# Patient Record
Sex: Male | Born: 1990 | Race: White | Hispanic: No | Marital: Single | Smoking: Current every day smoker
Health system: Southern US, Community
[De-identification: ages and names within clinical notes are randomized; demographics above are authoritative.]

---
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
## 2010-04-21 ENCOUNTER — Ambulatory Visit: Payer: Self-pay | Admitting: Family Medicine

## 2010-04-21 ENCOUNTER — Encounter: Payer: Self-pay | Admitting: Sports Medicine

## 2010-04-21 LAB — CONVERTED CEMR LAB: Rapid Strep: NEGATIVE

## 2010-05-20 ENCOUNTER — Telehealth: Payer: Self-pay | Admitting: *Deleted

## 2010-05-22 ENCOUNTER — Encounter: Payer: Self-pay | Admitting: Family Medicine

## 2010-05-22 ENCOUNTER — Ambulatory Visit: Payer: Self-pay | Admitting: Family Medicine

## 2010-05-22 LAB — CONVERTED CEMR LAB
Amphetamine Screen, Ur: NEGATIVE
Benzodiazepines.: NEGATIVE
Marijuana Metabolite: POSITIVE — AB
Phencyclidine (PCP): NEGATIVE

## 2010-05-25 ENCOUNTER — Telehealth: Payer: Self-pay | Admitting: *Deleted

## 2010-10-01 NOTE — Progress Notes (Signed)
Summary: UDS  Phone Note Call from Patient   Caller: Mom Call For: [phone] Summary of Call: Took pat to have teeth pulled,but they wouldn't put him to sleep.  Pt need to have drug screen on Friday so he can have his appt on Wed the 28th.  If results can't be given by the 27th please let mom know.   Initial call taken by: Britta Mccreedy mcgregor  Follow-up for Phone Call        Please contact the patient's mother to clarify why her adult grandson needs a "drug screen".   The patient is 20 years old, so we would not be able to obtain a drug screen unless he requested it.  Follow-up by: Tawanna Cooler McDiarmid MD,  May 21, 2010 7:38 AM  Additional Follow-up for Phone Call Additional follow up Details #1::        Attempted to call no answer and no machine.  Will try again tomorrow Additional Follow-up by: Jone Baseman CMA,  May 21, 2010 5:40 PM    Additional Follow-up for Phone Call Additional follow up Details #2::    Spoke with Kayen, he reports he needs a drug test to take to his dentist (Dr. Barbette Merino) next week in order to have his wisdom teeth removed. Spoke with Dr. McDiarmid and put in order for UDS, patient will come in today for lab visit. Follow-up by: Garen Grams LPN,  May 22, 2010 2:10 PM

## 2010-10-01 NOTE — Letter (Signed)
Summary: Handout Printed  Printed Handout:  - Upper Respiratory Infection (URI), Adult 

## 2010-10-01 NOTE — Assessment & Plan Note (Signed)
Summary: congestion,tcb   Vital Signs:  Patient profile:   20 year old male Weight:      153 pounds Temp:     99.1 degrees F oral Pulse rate:   73 / minute BP sitting:   119 / 70  (left arm) Cuff size:   regular  Vitals Entered By: Jimmy Footman, CMA (April 21, 2010 3:14 PM) CC: Cough and congestion x 5 days Is Patient Diabetic? No   Primary Care Chinedu Agustin:  Tawanna Cooler McDiarmid MD  CC:  Cough and congestion x 5 days.  History of Present Illness: URI Symptoms Onset: 5d Description: ST, cough, rhinorrhea. Modifying factors:  improving now.  Symptoms Nasal discharge: YES Fever: No Sore throat:YES  Cough: YES Wheezing: NO Ear pain: NO GI symptoms: NO Sick contacts: NO  Red Flags  Stiff neck: NO Dyspnea: NO Rash: NO Swallowing difficulty: NO  Sinusitis Risk Factors Headache/face pain: NO Double sickening: NO tooth pain: NO  Allergy Risk Factors Sneezing: NO Itchy scratchy throat: NO Seasonal symptoms: NO  Flu Risk Factors Headache: NO muscle aches: NO severe fatigue: NO    Habits & Providers  Alcohol-Tobacco-Diet     Alcohol drinks/day: 0     Tobacco Status: current     Tobacco Counseling: to quit use of tobacco products     Cigarette Packs/Day: 1/2     Passive Smoke Exposure: yes  Current Medications (verified): 1)  None  Allergies (verified): 1)  ! * Bee Sting  Review of Systems       See HPI  Physical Exam  General:  Well-developed,well-nourished,in no acute distress; alert,appropriate and cooperative throughout examination Head:  Normocephalic and atraumatic without obvious abnormalities. No apparent alopecia or balding. Eyes:  No corneal or conjunctival inflammation noted. EOMI. Perrla.  Ears:  External ear exam shows no significant lesions or deformities.  Otoscopic examination reveals clear canals, tympanic membranes are intact bilaterally without bulging, retraction, inflammation or discharge. Hearing is grossly normal bilaterally. Nose:   External nasal examination shows no deformity or inflammation. Nasal mucosa are erythematous. Mouth:  Oral mucosa pink and moist, R tonsil with small exudate. Neck:  Cervical lymphadenopathy. Lungs:  Normal respiratory effort, chest expands symmetrically. Lungs are clear to auscultation, no crackles or wheezes. Heart:  Normal rate and regular rhythm. S1 and S2 normal without gallop, murmur, click, rub or other extra sounds. Abdomen:  Bowel sounds positive,abdomen soft and non-tender without masses, organomegaly or hernias noted. Extremities:  No clubbing, cyanosis, edema.    Impression & Recommendations:  Problem # 1:  PHARYNGITIS, ACUTE (ICD-462) Assessment New LIkely viral with neg strep. Symptomatic treatment. Hydration. Relative rest. Smoking cessation advised. Handouts given.   Orders: Rapid Strep-[facility] (78469) [facility]- Est Level  3 (62952)  Patient Instructions: 1)  You have a viral upper respiratory infection, your strep test was negative. 2)  Keep well hydrated. 3)  Chicken soup 4)  "Tylenol Severe Cold" or "Theraflu" as needed 5)  Come back if it does not cont to get better.  These things can drag on for 2 weeks and the cough can persist for a month or more. 6)  -Dr. Karie Schwalbe.  Prevention & Chronic Care Immunizations   Influenza vaccine: Fluvax MCR  (07/03/2009)    Tetanus booster: Not documented    Pneumococcal vaccine: Not documented  Other Screening   Smoking status: current  (04/21/2010)   Smoking cessation counseling: YES  (09/22/2009)  Laboratory Results  Date/Time Received: April 21, 2010 3:36 PM  Date/Time Reported: April 21, 2010 3:45 PM   Other Tests  Rapid Strep: negative Comments: ...........test performed by...........Marland KitchenTerese Door, CMA

## 2010-10-01 NOTE — Assessment & Plan Note (Signed)
Summary: f/up,tcb   Vital Signs:  Patient profile:   20 year old male Weight:      156.3 pounds Temp:     97.6 degrees F oral Pulse rate:   97 / minute BP sitting:   124 / 75  (left arm) Cuff size:   regular  Vitals Entered By: Garen Grams LPN (September 22, 2009 3:46 PM) CC: f/u Is Patient Diabetic? No Pain Assessment Patient in pain? no        CC:  f/u.  History of Present Illness: CC: Knots on back of neck Knots on back of neck have resolved except for one in middle on back of neck.  No change in this knot in size since first noted in November 2010.  itching scalp has resolved.   Refrigerator dropped against right forearm yesterday trapping it against pickup sidebed. No swelling.  Was able to use arm immediately after injury.  Worse with wrist flaxion.     PMH: Atopic dermatitis ROS: No fever/chils.  No sore throat. No headache. No abdominal pain. No diarrhea.   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      Habits & Providers  Alcohol-Tobacco-Diet     Alcohol drinks/day: 0     Tobacco Status: current     Tobacco Counseling: to quit use of tobacco products  Current Medications (verified): 1)  None  Allergies (verified): 1)  ! * Bee Sting  Past History:  Past medical, surgical, family and social histories (including risk factors) reviewed for relevance to current acute and chronic problems.  Past Medical History: Reviewed history from 07/03/2009 and no changes required. 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  Knee Xray Bilat: Ant. 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 Knee Xray Bilat: Ant. Tib apophysitis - 04/22/2003  Family  History: Reviewed history from 07/03/2009 and no changes required. Brother with ADHD,  Father with substance dependence, behavior pxs at teenager, MGM with Asthma,  Mo. & Fa held back in school  Social History: Reviewed history from 07/03/2009 and no changes required. Biol Mo and Fa involved in his life.  Parents are divorced Smokes cigarettes 1/2 pack per day since 2005 Pt smokes marajuana.  Denies IVDA & crack use. Heterosexually active Currently using alcohol only occasionally, denies binge drinking Received Graduate Equivalence Diploma (GED)  Physical Exam  General:  alert, NAD  Neck:  1 cm subcutaneous mobile nontender cyst-like fullness with normal overlying skin located in mid-nape of neck Lungs:  normal respiratory effort, no intercostal retractions, and no accessory muscle use.   Heart:  normal rate, regular rhythm, and no murmur.   Msk:  Right forearm without edema or erythema FROM wrist and elbow and fingers. superficial abrasions to distal forearm volar surface.    Impression & Recommendations:  Problem # 1:  EPIDIDYMAL CYST (ICD-608.89) Assessment Unchanged  suspect a benign cysts.  Will monitor for change.  Patient instructed to contact me if there is change in size of cyst or if it starts to itch or hurt.   Orders: [facility]- Est Level  2 (04540)  Problem # 2:  ECZEMA, ATOPIC DERMATITIS (ICD-691.8) Assessment: Improved  Monitor for exacerbation.  The following medications were removed from the medication list:  Fluocinonide 0.05 % Gel (Fluocinonide) .Marland Kitchen... Apply to scalp twice a day for two weeks. disp: 60 gram.  Orders: [facility]- Est Level  2 (16109)  Problem # 3:  Hx of TOBACCO DEPENDENCE (ICD-305.1) Assessment: Comment Only Not interested in stopping smoking at this time.  Advised him that we will be available to him when he does decide to quit smoking.  Patient Instructions: 1)  Please schedule a follow-up appointment as needed .  2)  Tobacco is very bad  for your health and your loved ones ! You should stop smoking !  3)  Stop smoking tips: Choose a quit date. Cut down before the quit date. Decide what you will do as a substitute when you feel the urge to smoke(gum, toothpick, exercise).

## 2010-10-01 NOTE — Progress Notes (Signed)
Summary: Test Res  Phone Note Call from Patient Call back at [phone]   Caller: [name] Summary of Call: wants to see if his test results are ready for her to pick up.  Said that he left permission for her to pick them up. Initial call taken by: Clydell Hakim,  May 25, 2010 11:13 AM  Follow-up for Phone Call        Print out left at front desk.  Mom notified that it would be waitng for her with her name on it. Follow-up by: Dennison Nancy RN,  May 25, 2010 11:28 AM

## 2010-10-15 ENCOUNTER — Ambulatory Visit: Payer: Self-pay | Admitting: Family Medicine

## 2011-05-06 ENCOUNTER — Inpatient Hospital Stay (INDEPENDENT_AMBULATORY_CARE_PROVIDER_SITE_OTHER)
Admission: RE | Admit: 2011-05-06 | Discharge: 2011-05-06 | Disposition: A | Discharge status: Home / Self Care | Payer: Self-pay | Source: Ambulatory Visit | Attending: Emergency Medicine | Admitting: Emergency Medicine

## 2011-05-06 DIAGNOSIS — M26609 Unspecified temporomandibular joint disorder, unspecified side: Secondary | ICD-10-CM

## 2013-08-16 ENCOUNTER — Emergency Department (INDEPENDENT_AMBULATORY_CARE_PROVIDER_SITE_OTHER)
Admission: EM | Admit: 2013-08-16 | Discharge: 2013-08-16 | Disposition: A | Discharge status: Home / Self Care | Payer: Self-pay | Source: Home / Self Care | Attending: Family Medicine | Admitting: Family Medicine

## 2013-08-16 ENCOUNTER — Encounter (HOSPITAL_COMMUNITY): Payer: Self-pay | Admitting: Emergency Medicine

## 2013-08-16 ENCOUNTER — Other Ambulatory Visit (HOSPITAL_COMMUNITY)
Admission: RE | Admit: 2013-08-16 | Discharge: 2013-08-16 | Disposition: A | Discharge status: Home / Self Care | Payer: Self-pay | Source: Ambulatory Visit | Attending: Family Medicine | Admitting: Family Medicine

## 2013-08-16 DIAGNOSIS — R109 Unspecified abdominal pain: Secondary | ICD-10-CM

## 2013-08-16 DIAGNOSIS — Z113 Encounter for screening for infections with a predominantly sexual mode of transmission: Secondary | ICD-10-CM | POA: Insufficient documentation

## 2013-08-16 DIAGNOSIS — N50812 Left testicular pain: Secondary | ICD-10-CM

## 2013-08-16 DIAGNOSIS — N509 Disorder of male genital organs, unspecified: Secondary | ICD-10-CM

## 2013-08-16 LAB — POCT URINALYSIS DIP (DEVICE)
Glucose, UA: NEGATIVE mg/dL
Leukocytes, UA: NEGATIVE
Nitrite: NEGATIVE
Urobilinogen, UA: 0.2 mg/dL (ref 0.0–1.0)

## 2013-08-16 NOTE — ED Provider Notes (Addendum)
CSN: [id number]     Arrival date & time 08/16/13  1102 History   First MD Initiated Contact with Patient 08/16/13 1158     Chief Complaint  Patient presents with  . Groin Pain   (Consider location/radiation/quality/duration/timing/severity/associated sxs/prior Treatment) HPI Comments: 22 year old male presents complaining of 2 weeks of intermittent pain and some mild swelling in his left testicle area. The past 2 weeks, he has been having a few daily episodes of pain that lasts anywhere from a minute to about 30 minutes. The pain originates in the testicle and radiates up towards the left side of the abdomen. He thinks he may have felt a small cyst on the testicle initially but has not felt anything since then. This pain is sometimes associated with carrying heavy loads, but has also happened at rest. Is not her to do anything to help the pain because he does not know what to take. He denies any other systemic symptoms and all other review of systems negative. No known exposure to STDs  Patient is a 22 y.o. male presenting with groin pain.  Groin Pain Pertinent negatives include no chest pain, no abdominal pain and no shortness of breath.    History reviewed. No pertinent past medical history. History reviewed. No pertinent past surgical history. History reviewed. No pertinent family history. History  Substance Use Topics  . Smoking status: Current Every Day Smoker  . Smokeless tobacco: Not on file  . Alcohol Use: No    Review of Systems  Constitutional: Negative for fever, chills and fatigue.  HENT: Negative for sore throat.   Eyes: Negative for visual disturbance.  Respiratory: Negative for cough and shortness of breath.   Cardiovascular: Negative for chest pain, palpitations and leg swelling.  Gastrointestinal: Negative for nausea, vomiting, abdominal pain, diarrhea, constipation and rectal pain.  Genitourinary: Positive for testicular pain (and swelling; see history of present  illness). Negative for dysuria, urgency, frequency, hematuria, flank pain, decreased urine volume, discharge, penile swelling, scrotal swelling, difficulty urinating, genital sores and penile pain.  Musculoskeletal: Negative for arthralgias, myalgias, neck pain and neck stiffness.  Skin: Negative for rash.  Neurological: Negative for dizziness, weakness and light-headedness.    Allergies  Review of patient's allergies indicates no known allergies.  Home Medications  No current outpatient prescriptions on file. BP 129/62  Pulse 68  Temp(Src) 98.3 F (36.8 C) (Oral)  Resp 14  SpO2 100% Physical Exam  Nursing note and vitals reviewed. Constitutional: He is oriented to person, place, and time. He appears well-developed and well-nourished. No distress.  HENT:  Head: Normocephalic.  Pulmonary/Chest: Effort normal. No respiratory distress.  Abdominal: Soft. Normal appearance. He exhibits no mass. There is no tenderness. There is no rebound and no guarding. No hernia. Hernia confirmed negative in the left inguinal area.  Genitourinary: Testes normal and penis normal. Left testis shows no mass, no swelling and no tenderness.  Lymphadenopathy:       Left: No inguinal adenopathy present.  Neurological: He is alert and oriented to person, place, and time. Coordination normal.  Skin: Skin is warm and dry. No rash noted. He is not diaphoretic.  Psychiatric: He has a normal mood and affect. Judgment normal.    ED Course  Procedures (including critical care time) Labs Review Labs Reviewed  URINE CULTURE  POCT URINALYSIS DIP (DEVICE)  URINE CYTOLOGY ANCILLARY ONLY   Imaging Review No results found.    MDM   1. Testicular pain, left   2. Abdominal pain  Urinalysis is negative  The differential for this problem epididymitis/orchitis, sliding inguinal hernia, prostatitis, or referred pain from something such as nephrolithiasis. Urinalysis is negative for any hematuria which makes  nephrolithiasis less likely. Sending labs to check for any other infection. If this is negative, he'll followup with his primary care physician for imaging to further delineate the source of his pain. He is advised to go to the emergency department if the pain returns or becomes constant which may indicate incarcerated inguinal hernia.      Graylon Good, PA-C 08/16/13 1319    Labs came back positive for chlamydia.  Rx for doxy sent in to pharmacy, pt informed.    Graylon Good, PA-C 08/20/13 1531

## 2013-08-16 NOTE — ED Notes (Signed)
Pt  Reports  severeal  Weeks  Ago  He  Had  Noticed   Some  Swelling  l    grion area  Which  spontanously resolved  He  Now  Reports  Episodes  Of  intermittant       Pain  Which        Is  Better  Now           He  denys  Any  Urinary  Symptoms  Such  As     Discharge  Or  Bleeding

## 2013-08-18 LAB — URINE CULTURE: Culture: NO GROWTH

## 2013-08-18 NOTE — ED Provider Notes (Signed)
Medical screening examination/treatment/procedure(s) were performed by a resident physician or non-physician practitioner and as the supervising physician I was immediately available for consultation/collaboration.  Edrei Norgaard, MD    Daymon Hora S Chloie Loney, MD 08/18/13 0851 

## 2013-08-20 ENCOUNTER — Telehealth (HOSPITAL_COMMUNITY): Payer: Self-pay | Admitting: *Deleted

## 2013-08-20 MED ORDER — DOXYCYCLINE HYCLATE 100 MG PO CAPS: 100.0000 mg | ORAL_CAPSULE | Freq: Two times a day (BID) | ORAL | Status: AC

## 2013-08-20 NOTE — ED Notes (Signed)
GC and Trich neg., Chlamydia pos., Urine culture: No growth.  Pt. called in for his results.  Pt. verified x 2 and given results.  Pt. told he needs treated and asked him which pharmacy.  [facility] said [facility] on [address] Pt. instructed to notify his partner, no sex for 1 week and to practice safe sex. Pt. told he can get HIV testing at the [facility]. STD clinic, by appointment.  Pt. voiced understanding. Almedia Balls e-prescribed Doxycycline to pt.'s pharmacy.  DHHS form completed and faxed to the [facility] Department. Vassie Moselle 08/20/2013

## 2013-08-21 NOTE — ED Provider Notes (Signed)
Medical screening examination/treatment/procedure(s) were performed by a resident physician or non-physician practitioner and as the supervising physician I was immediately available for consultation/collaboration.  Emberly Tomasso, MD    Lashica Hannay S Ailani Governale, MD 08/21/13 1342 

## 2014-04-04 ENCOUNTER — Emergency Department (INDEPENDENT_AMBULATORY_CARE_PROVIDER_SITE_OTHER)
Admission: EM | Admit: 2014-04-04 | Discharge: 2014-04-04 | Disposition: A | Discharge status: Home / Self Care | Payer: BC Managed Care – PPO | Source: Home / Self Care

## 2014-04-04 DIAGNOSIS — R809 Proteinuria, unspecified: Secondary | ICD-10-CM

## 2014-04-04 DIAGNOSIS — E860 Dehydration: Secondary | ICD-10-CM

## 2014-04-04 DIAGNOSIS — R1110 Vomiting, unspecified: Secondary | ICD-10-CM

## 2014-04-04 DIAGNOSIS — R7989 Other specified abnormal findings of blood chemistry: Secondary | ICD-10-CM

## 2014-04-04 DIAGNOSIS — N19 Unspecified kidney failure: Secondary | ICD-10-CM

## 2014-04-04 DIAGNOSIS — R1115 Cyclical vomiting syndrome unrelated to migraine: Secondary | ICD-10-CM

## 2014-04-04 LAB — POCT I-STAT, CHEM 8
BUN: 34 mg/dL — ABNORMAL HIGH (ref 6–23)
CALCIUM ION: 1.18 mmol/L (ref 1.12–1.23)
CHLORIDE: 97 meq/L (ref 96–112)
Creatinine, Ser: 4.1 mg/dL — ABNORMAL HIGH (ref 0.50–1.35)
GLUCOSE: 118 mg/dL — AB (ref 70–99)
HCT: 64 % — ABNORMAL HIGH (ref 39.0–52.0)
Hemoglobin: 21.8 g/dL (ref 13.0–17.0)
Potassium: 4.2 mEq/L (ref 3.7–5.3)
Sodium: 134 mEq/L — ABNORMAL LOW (ref 137–147)
TCO2: 22 mmol/L (ref 0–100)

## 2014-04-04 LAB — POCT URINALYSIS DIP (DEVICE)
Glucose, UA: NEGATIVE mg/dL
Ketones, ur: 15 mg/dL — AB
LEUKOCYTES UA: NEGATIVE
Nitrite: NEGATIVE
PH: 5 (ref 5.0–8.0)
PROTEIN: 100 mg/dL — AB
Specific Gravity, Urine: >=1.03 (ref 1.005–1.030)
Urobilinogen, UA: 0.2 mg/dL (ref 0.0–1.0)

## 2014-04-04 MED ORDER — ONDANSETRON 4 MG PO TBDP
4.0000 mg | ORAL_TABLET | Freq: Once | ORAL | Status: AC
  Administered 2014-04-04: 4 mg via ORAL

## 2014-04-04 MED ORDER — ONDANSETRON HCL 4 MG PO TABS: 4.0000 mg | ORAL_TABLET | Freq: Four times a day (QID) | ORAL | Status: AC

## 2014-04-04 MED ORDER — SODIUM CHLORIDE 0.9 % IV SOLN
Freq: Once | INTRAVENOUS | Status: AC
Start: 2014-04-04 — End: 2014-04-04
  Administered 2014-04-04: 11:00:00 via INTRAVENOUS

## 2014-04-04 MED ORDER — ONDANSETRON 4 MG PO TBDP
ORAL_TABLET | ORAL | Status: AC
  Filled 2014-04-04: qty 1

## 2014-04-04 NOTE — Discharge Instructions (Signed)
Dehydration, Adult Dehydration means your body does not have as much fluid as it needs. Your kidneys, brain, and heart will not work properly without the right amount of fluids and salt.  HOME CARE  Ask your doctor how to replace body fluid losses (rehydrate).  Drink enough fluids to keep your pee (urine) clear or pale yellow.  Drink small amounts of fluids often if you feel sick to your stomach (nauseous) or throw up (vomit).  Eat like you normally do.  Avoid:  Foods or drinks high in sugar.  Bubbly (carbonated) drinks.  Juice.  Very hot or cold fluids.  Drinks with caffeine.  Fatty, greasy foods.  Alcohol.  Tobacco.  Eating too much.  Gelatin desserts.  Wash your hands to avoid spreading germs (bacteria, viruses).  Only take medicine as told by your doctor.  Keep all doctor visits as told. GET HELP RIGHT AWAY IF:   You cannot drink something without throwing up.  You get worse even with treatment.  Your vomit has blood in it or looks greenish.  Your poop (stool) has blood in it or looks black and tarry.  You have not peed in 6 to 8 hours.  You pee a small amount of very dark pee.  You have a fever.  You pass out (faint).  You have belly (abdominal) pain that gets worse or stays in one spot (localizes).  You have a rash, stiff neck, or bad headache.  You get easily annoyed, sleepy, or are hard to wake up.  You feel weak, dizzy, or very thirsty. MAKE SURE YOU:   Understand these instructions.  Will watch your condition.  Will get help right away if you are not doing well or get worse. Document Released: 06/12/2009 Document Revised: 11/08/2011 Document Reviewed: 04/05/2011 [facility] Patient Information 2015 [place], [place]. This information is not intended to replace advice given to you by your health care provider. Make sure you discuss any questions you have with your health care provider.  Nausea and Vomiting Nausea means you feel sick to  your stomach. Throwing up (vomiting) is a reflex where stomach contents come out of your mouth. HOME CARE   Take medicine as told by your doctor.  Do not force yourself to eat. However, you do need to drink fluids.  If you feel like eating, eat a normal diet as told by your doctor.  Eat rice, wheat, potatoes, bread, lean meats, yogurt, fruits, and vegetables.  Avoid high-fat foods.  Drink enough fluids to keep your pee (urine) clear or pale yellow.  Ask your doctor how to replace body fluid losses (rehydrate). Signs of body fluid loss (dehydration) include:  Feeling very thirsty.  Dry lips and mouth.  Feeling dizzy.  Dark pee.  Peeing less than normal.  Feeling confused.  Fast breathing or heart rate. GET HELP RIGHT AWAY IF:   You have blood in your throw up.  You have black or bloody poop (stool).  You have a bad headache or stiff neck.  You feel confused.  You have bad belly (abdominal) pain.  You have chest pain or trouble breathing.  You do not pee at least once every 8 hours.  You have cold, clammy skin.  You keep throwing up after 24 to 48 hours.  You have a fever. MAKE SURE YOU:   Understand these instructions.  Will watch your condition.  Will get help right away if you are not doing well or get worse. Document Released: 02/02/2008 Document Revised: 11/08/2011  Document Reviewed: 01/15/2011 [facility] Patient Information 2015 [place], [place]. This information is not intended to replace advice given to you by your health care provider. Make sure you discuss any questions you have with your health care provider.  Rehydration, Adult Rehydration is the replacement of body fluids lost during dehydration. Dehydration is an extreme loss of body fluids to the point of body function impairment. There are many ways extreme fluid loss can occur, including vomiting, diarrhea, or excess sweating. Recovering from dehydration requires replacing lost fluids,  continuing to eat to maintain strength, and avoiding foods and beverages that may contribute to further fluid loss or may increase nausea. HOW TO REHYDRATE In most cases, rehydration involves the replacement of not only fluids but also carbohydrates and basic body salts. Rehydration with an oral rehydration solution is one way to replace essential nutrients lost through dehydration. An oral rehydration solution can be purchased at pharmacies, retail stores, and online. Premixed packets of powder that you combine with water to make a solution are also sold. You can prepare an oral rehydration solution at home by mixing the following ingredients together:    - tsp table salt.   tsp baking soda.   tsp salt substitute containing potassium chloride.  1 tablespoons sugar.  1 L (34 oz) of water. Be sure to use exact measurements. Including too much sugar can make diarrhea worse. Drink -1 cup (120-240 mL) of oral rehydration solution each time you have diarrhea or vomit. If drinking this amount makes your vomiting worse, try drinking smaller amounts more often. For example, drink 1-3 tsp every 5-10 minutes.  A general rule for staying hydrated is to drink 1-2 L of fluid per day. Talk to your caregiver about the specific amount you should be drinking each day. Drink enough fluids to keep your urine clear or pale yellow. EATING WHEN DEHYDRATED Even if you have had severe sweating or you are having diarrhea, do not stop eating. Many healthy items in a normal diet are okay to continue eating while recovering from dehydration. The following tips can help you to lessen nausea when you eat:  Ask someone else to prepare your food. Cooking smells may worsen nausea.  Eat in a well-ventilated room away from cooking smells.  Sit up when you eat. Avoid lying down until 1-2 hours after eating.  Eat small amounts when you eat.  Eat foods that are easy to digest. These include soft, well-cooked, or mashed  foods. FOODS AND BEVERAGES TO AVOID Avoid eating or drinking the following foods and beverages that may increase nausea or further loss of fluid:   Fruit juices with a high sugar content, such as concentrated juices.  Alcohol.  Beverages containing caffeine.  Carbonated drinks. They may cause a lot of gas.  Foods that may cause a lot of gas, such as cabbage, broccoli, and beans.  Fatty, greasy, and fried foods.  Spicy, very salty, and very sweet foods or drinks.  Foods or drinks that are very hot or very cold. Consume food or drinks at or near room temperature.  Foods that need a lot of chewing, such as raw vegetables.  Foods that are sticky or hard to swallow, such as peanut butter. Document Released: 11/08/2011 Document Revised: 05/10/2012 Document Reviewed: 11/08/2011 [facility] Patient Information 2015 [place], [place]. This information is not intended to replace advice given to you by your health care provider. Make sure you discuss any questions you have with your health care provider.

## 2014-04-04 NOTE — ED Notes (Signed)
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

## 2014-04-04 NOTE — ED Provider Notes (Signed)
CSN: [id number]     Arrival date & time 04/04/14  0940 History   First MD Initiated Contact with Patient 04/04/14 [phone]     Chief Complaint  Patient presents with  . 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 While at work he vomited 3-4 times. After he went home he vomited another 4-5 times. His significant other states that he is unable to drink flavored liquids. He denies fever or diarrhea. He complains of pain across the bilateral abdomen radiating posteriorly. No pain to the anterior abdomen.   No past medical history on file. No past surgical history on file. No family history on file. History  Substance Use Topics  . Smoking status: Current Every Day Smoker  . Smokeless tobacco: Not on file  . Alcohol Use: No    Review of Systems  Constitutional: Positive for activity change and fatigue. Negative for fever.  HENT: Negative.   Respiratory: Negative for cough and shortness of breath.   Cardiovascular: Positive for chest pain. Negative for leg swelling.  Gastrointestinal: Positive for nausea and vomiting. Negative for blood in stool.  Genitourinary: Negative.   Musculoskeletal: Positive for back pain and myalgias.  Skin: Positive for pallor.  Neurological: Negative for syncope, speech difficulty and numbness.    Allergies  Review of patient's allergies indicates no known allergies.  Home Medications   Prior to Admission medications   Medication Sig Start Date End Date Taking? Authorizing Provider  doxycycline (VIBRAMYCIN) 100 MG capsule Take 1 capsule (100 mg total) by mouth 2 (two) times daily. 08/20/13   Adrian BlackwaterZachary H Baker, PA-C  ondansetron (ZOFRAN) 4 MG tablet Take 1 tablet (4 mg total) by mouth every 6 (six) hours. 04/04/14   Hayden Rasmussenavid Lenita Peregrina, NP   BP 126/93  Pulse 96  Temp(Src) 97.9 F  (36.6 C) (Oral)  Resp 18  SpO2 99% Physical Exam  Nursing note and vitals reviewed. Constitutional: He is oriented to person, place, and time. He appears well-developed and well-nourished. No distress.  Eyes: Conjunctivae and EOM are normal.  Neck: Normal range of motion. Neck supple.  Cardiovascular: Normal rate, regular rhythm and normal heart sounds.   Pulmonary/Chest: Effort normal.  Mildly prolonged expiratory phase. Diminished sounds in the bases. No wheezing. Tenderness to the bilateral abdominal wall musculature and bilateral low back musculature  Abdominal: Soft. Bowel sounds are normal. He exhibits no distension and no mass. There is no tenderness. There is no rebound.  Musculoskeletal: Normal range of motion. He exhibits no edema.  Neurological: He is alert and oriented to person, place, and time. He exhibits normal muscle tone.  Skin: Skin is warm and dry.  Psychiatric: He has a normal mood and affect.    ED Course  Procedures (including critical care time) Labs Review Labs Reviewed  POCT URINALYSIS DIP (DEVICE) - Abnormal; Notable for the following:    Bilirubin Urine LARGE (*)    Ketones, ur 15 (*)    Hgb urine dipstick SMALL (*)    Protein, ur 100 (*)    All other components within normal limits  POCT I-STAT, CHEM 8 - Abnormal; Notable for the following:    Sodium 134 (*)    BUN 34 (*)    Creatinine, Ser 4.10 (*)    Glucose, Bld 118 (*)    Hemoglobin 21.8 (*)    HCT 64.0 (*)    All other components  within normal limits    Imaging Review No results found.   MDM   1. Dehydration   2. Acute prerenal azotemia   3. Intractable vomiting with nausea, vomiting of unspecified type   4. Proteinuria    Patient received 2 L of normal saline and one dose of Zofran by mouth. He states he feels much better, more energetic and more like himself. Probably seeding the IV fluids and observation he is unable to drain cold water and eat some crackers without any nausea or  vomiting. He states he is ready to go home. He is discharged in a stable and improved condition with a prescription for Zofran and instructions for rehydration. Should there be any worsening, new symptoms or problems he should see his PCP or may return when necessary.     IV NS 1L W.O. X 2  Zofran 4 mg po.   Hayden Rasmussen, NP 04/04/14 [phone]

## 2014-04-04 NOTE — ED Notes (Signed)
Pt reports he feels much better after 2nd IV bag.

## 2014-04-05 NOTE — ED Provider Notes (Signed)
Medical screening examination/treatment/procedure(s) were performed by a resident physician or non-physician practitioner and as the supervising physician I was immediately available for consultation/collaboration.  Aza Dantes, MD    Delorise Hunkele S Daqwan Dougal, MD 04/05/14 0729
